# Patient Record
Sex: Male | Born: 1980 | Race: White | Hispanic: Yes | Marital: Single | Smoking: Never smoker
Health system: Southern US, Community
[De-identification: ages and names within clinical notes are randomized; demographics above are authoritative.]

## PROBLEM LIST (undated history)

## (undated) DIAGNOSIS — K760 Fatty (change of) liver, not elsewhere classified: Secondary | ICD-10-CM

## (undated) HISTORY — DX: Fatty (change of) liver, not elsewhere classified: K76.0

## (undated) HISTORY — PX: CHOLECYSTECTOMY: SHX55

---

## 2005-06-28 ENCOUNTER — Emergency Department (HOSPITAL_COMMUNITY): Admission: EM | Admit: 2005-06-28 | Discharge: 2005-06-28 | Payer: Self-pay | Admitting: Emergency Medicine
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
## 2006-06-14 ENCOUNTER — Emergency Department (HOSPITAL_COMMUNITY): Admission: EM | Admit: 2006-06-14 | Discharge: 2006-06-14 | Payer: Self-pay | Admitting: Emergency Medicine

## 2006-09-12 IMAGING — CR DG ANKLE COMPLETE 3+V*L*
3 series · 3 of 3 positions shown · non-contrast
Comparison: none

CLINICAL DATA: Twisted ankle.
 LEFT ANKLE ? 3 VIEW:

[view not recorded (1 of 3)]
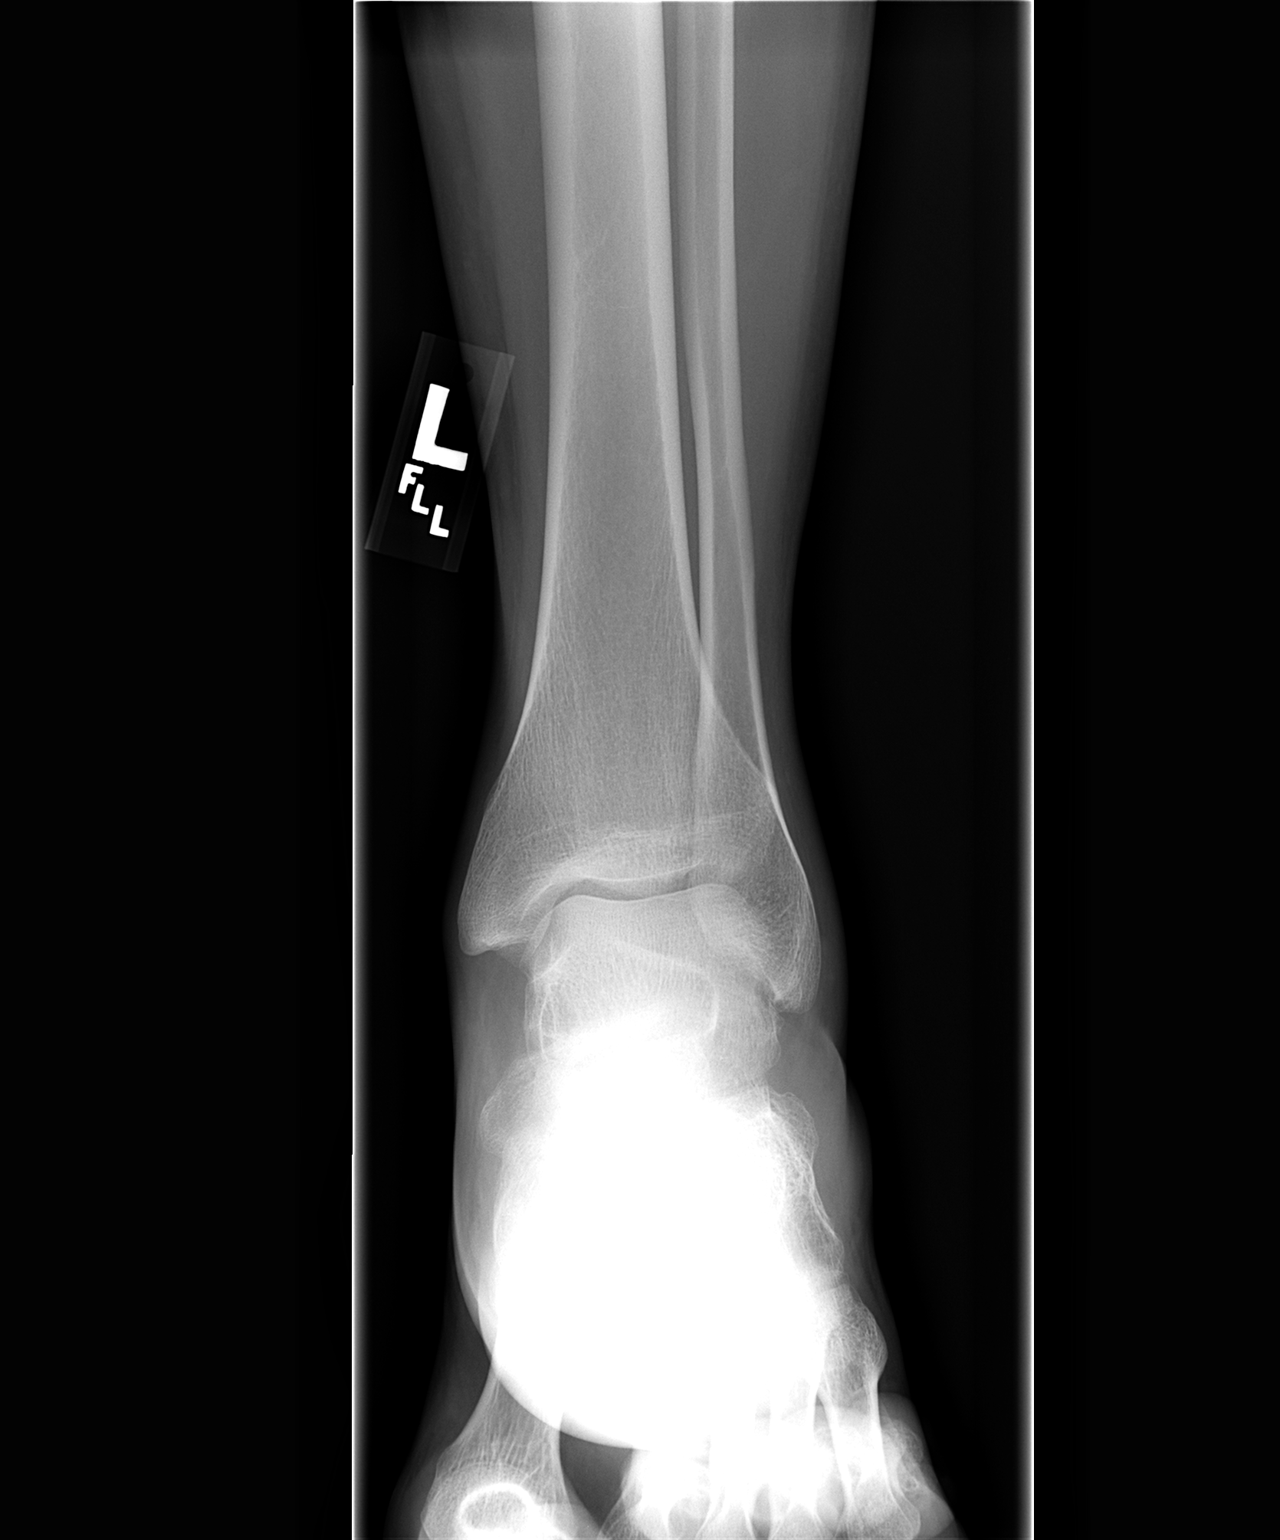

[view not recorded (2 of 3)]
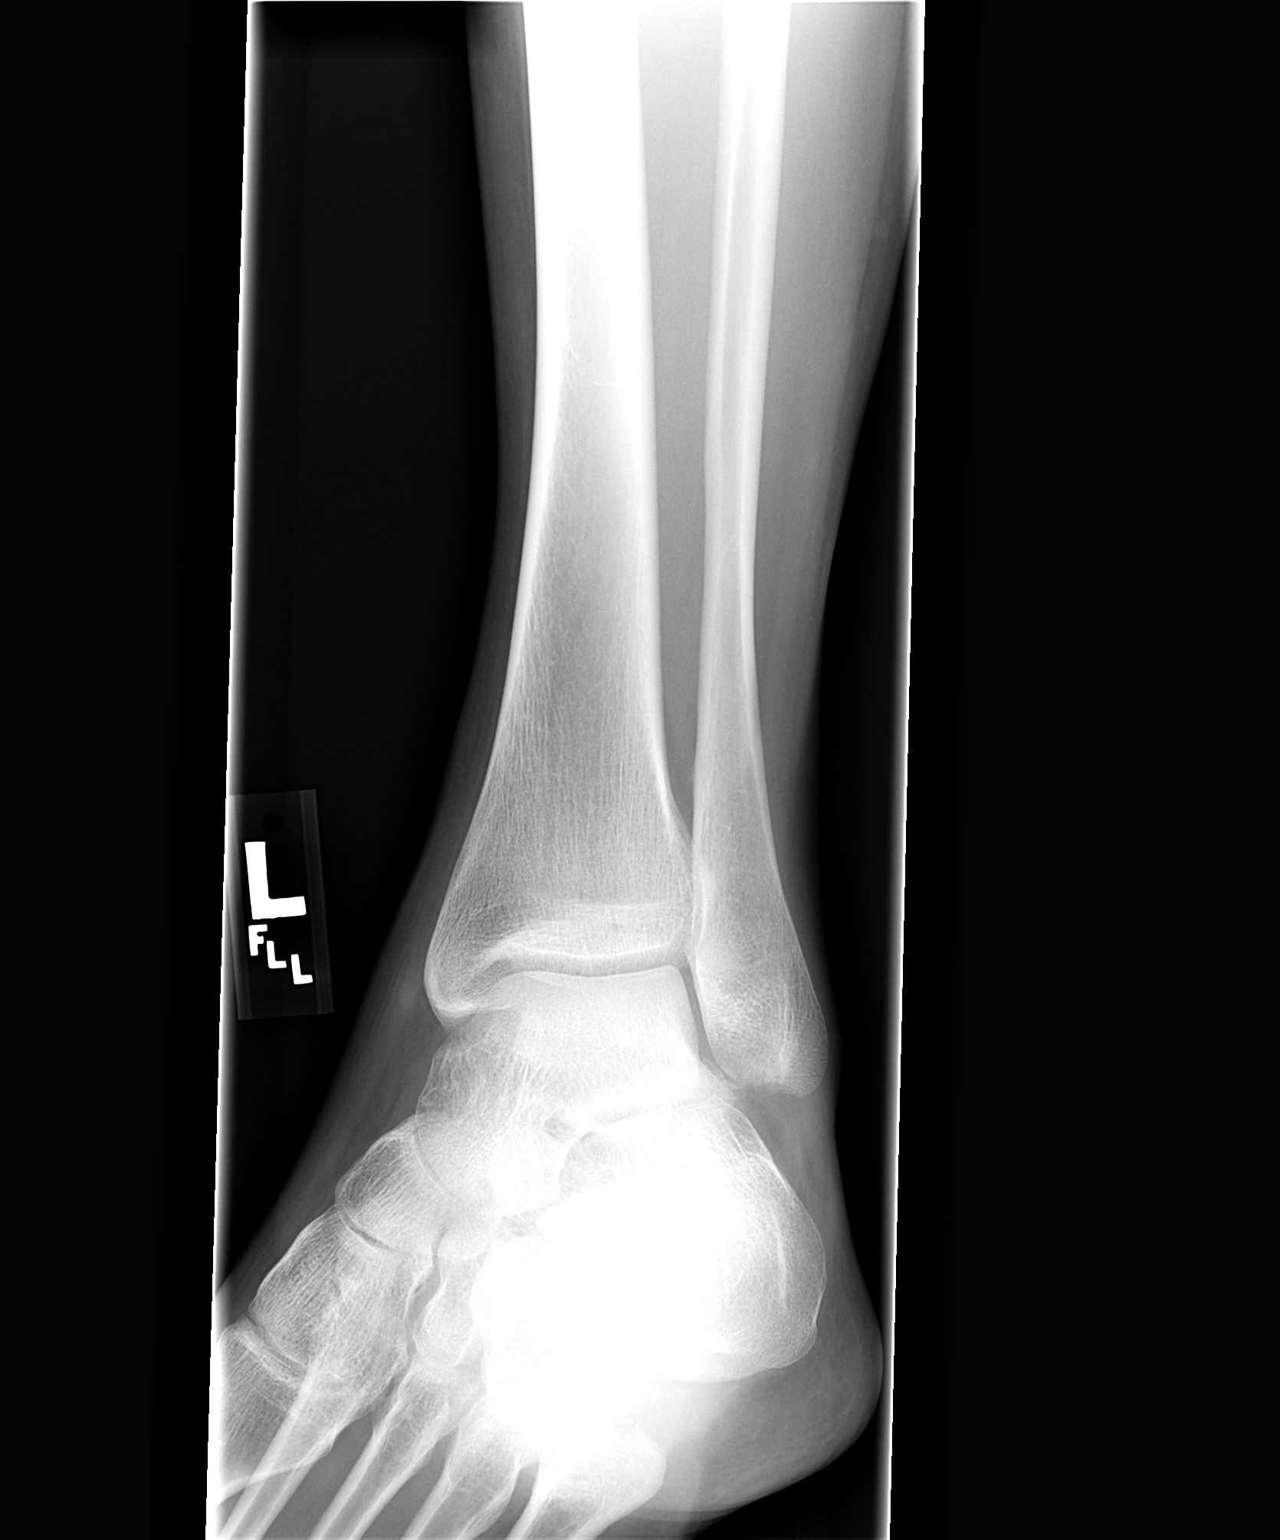

[view not recorded (3 of 3)]
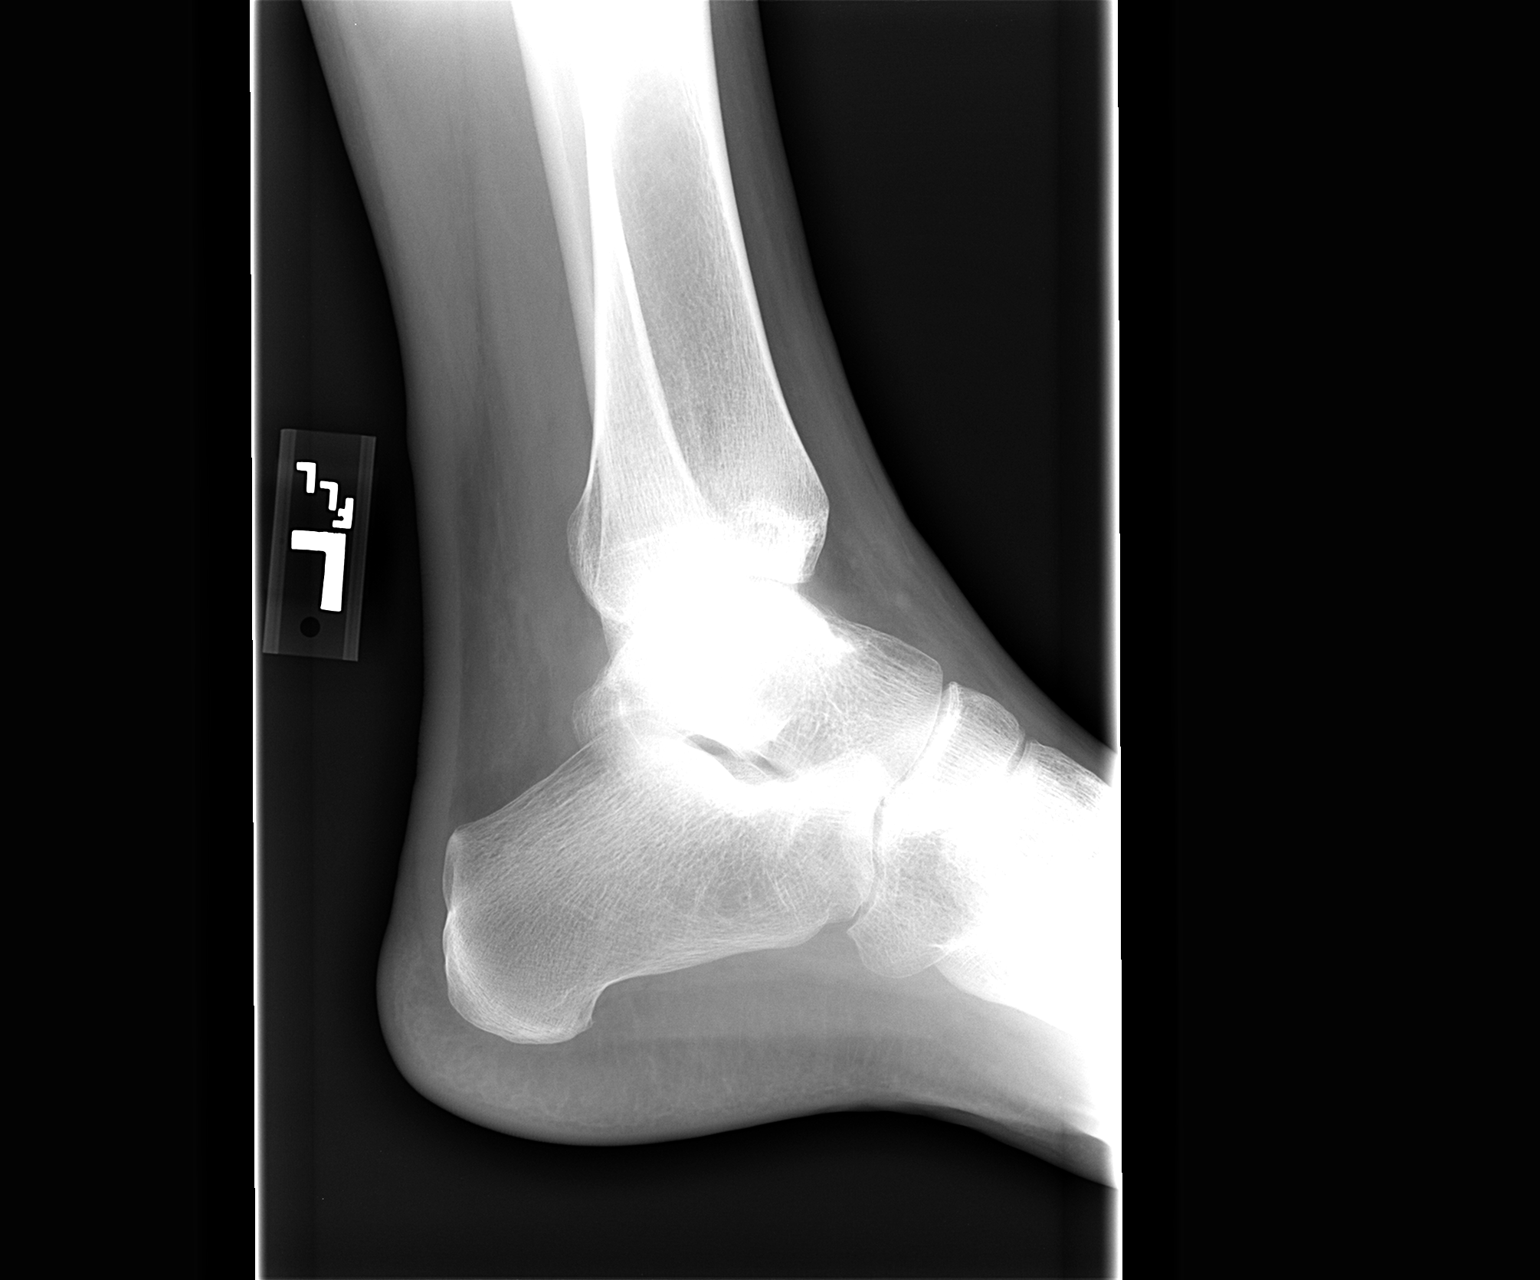

[3 of 3 positions shown; findings below may reference images not displayed]

FINDINGS: Imaged bones, joints and soft tissues appear normal.
IMPRESSION: Negative exam.

## 2010-04-04 ENCOUNTER — Ambulatory Visit (HOSPITAL_COMMUNITY): Admission: RE | Admit: 2010-04-04 | Discharge: 2010-04-04 | Payer: Self-pay | Admitting: General Surgery

## 2011-01-30 LAB — BASIC METABOLIC PANEL
BUN: 10 mg/dL (ref 6–23)
Calcium: 9.4 mg/dL (ref 8.4–10.5)
Chloride: 104 mEq/L (ref 96–112)
Creatinine, Ser: 0.95 mg/dL (ref 0.4–1.5)
GFR calc Af Amer: >60 mL/min (ref >60)
Sodium: 139 mEq/L (ref 135–145)

## 2011-01-30 LAB — CBC
HCT: 46.4 % (ref 39.0–52.0)
Hemoglobin: 16.1 g/dL (ref 13.0–17.0)
MCHC: 34.7 g/dL (ref 30.0–36.0)
MCV: 86.8 fL (ref 78.0–100.0)
RDW: 13.2 % (ref 11.5–15.5)

## 2015-07-08 ENCOUNTER — Encounter: Payer: Self-pay | Admitting: Family

## 2015-07-08 ENCOUNTER — Ambulatory Visit (INDEPENDENT_AMBULATORY_CARE_PROVIDER_SITE_OTHER): Payer: Self-pay | Admitting: Family

## 2015-07-08 VITALS — BP 132/92 | HR 84 | Temp 97.6°F | Resp 18 | Ht 71.0 in | Wt 180.0 lb

## 2015-07-08 DIAGNOSIS — R109 Unspecified abdominal pain: Secondary | ICD-10-CM

## 2015-07-08 DIAGNOSIS — R5383 Other fatigue: Secondary | ICD-10-CM | POA: Insufficient documentation

## 2015-07-08 HISTORY — DX: Other fatigue: R53.83

## 2015-07-08 HISTORY — DX: Unspecified abdominal pain: R10.9

## 2015-07-08 NOTE — Assessment & Plan Note (Signed)
Right-sided abdominal/rib pain of undetermined origin. Abdominal exam benign and unable to reproduce symptoms described. Question possible relation to fatty liver. Obtain complete metabolic panel to rule out liver dysfunction. Patient is currently self-pay, pending lab work if affordable possible ultrasound to determine current status.

## 2015-07-08 NOTE — Progress Notes (Signed)
Subjective:    Patient ID: Patrick Charles, male    DOB: 02-07-1981, 34 y.o.   MRN: [id number]  Chief Complaint  Patient presents with  . Establish Care    having issues with being tired and headaches, has also been feeling pain on his right side, when he got his gall bladder out the doctor said he has fatty liver    HPI:  Patrick Charles is a 34 y.o. male with a PMH of nonalcoholic fatty liver disease who presents today for an office visit to establish care.    1.) Fatigue - Associated symptom of tiredness/fatigue have been going on for a couple of years. Currently averaging about 5-6 hours of sleep on average. Denies any treatments to improve his fatigue.   2.) Right side pain - Associated symptom of pain located in his right upper quadrant of his abdomen has been going on for about 4 years. Describes the pain radiates around his side to his back. Aggrevating factors include eating and when he drinks a lot of fluids. Severity of the pain is about an 8/10 and and describes it as pressure. Frequency comes and goes.   3.) Fatty liver - Previously diagnosed with fatty liver in about 2010. Has not had any liver function tests done.   Allergies  Allergen Reactions  . Penicillins     Mouth sores     No outpatient prescriptions prior to visit.   No facility-administered medications prior to visit.     Past Medical History  Diagnosis Date  . NAFL (nonalcoholic fatty liver)      Past Surgical History  Procedure Laterality Date  . Cholecystectomy       Family History  Problem Relation Age of Onset  . Healthy Mother   . Healthy Father   . Diabetes Maternal Grandmother   . Gallbladder disease Maternal Grandfather      Social History   Social History  . Marital Status: Single    Spouse Name: N/A  . Number of Children: 3  . Years of Education: 14   Occupational History  . Server    Social History Main Topics  . Smoking status: Never Smoker   .  Smokeless tobacco: Never Used  . Alcohol Use: 0.0 oz/week    0 Standard drinks or equivalent per week     Comment: Rarely  . Drug Use: No  . Sexual Activity: Not on file   Other Topics Concern  . Not on file   Social History Narrative   Fun: No time to have fun, travel   Denies religious beliefs effecting health care    Review of Systems  Constitutional: Negative for fever and chills.  Respiratory: Negative for chest tightness and shortness of breath.   Cardiovascular: Negative for chest pain, palpitations and leg swelling.  Gastrointestinal: Negative for nausea, vomiting, diarrhea, constipation and blood in stool.      Objective:    BP 132/92 mmHg  Pulse 84  Temp(Src) 97.6 F (36.4 C) (Oral)  Resp 18  Ht '5\' 11"'  (1.803 m)  Wt 180 lb (81.647 kg)  BMI 25.12 kg/m2  SpO2 97% Nursing note and vital signs reviewed.  Physical Exam  Constitutional: He is oriented to person, place, and time. He appears well-developed and well-nourished. No distress.  Cardiovascular: Normal rate, regular rhythm, normal heart sounds and intact distal pulses.   Pulmonary/Chest: Effort normal and breath sounds normal.  Abdominal: Bowel sounds are normal. He exhibits no mass. There is no hepatosplenomegaly.  There is no tenderness. There is no rigidity, no rebound, no guarding, no tenderness at McBurney's point and negative Murphy's sign.  Neurological: He is alert and oriented to person, place, and time.  Skin: Skin is warm and dry.  Psychiatric: He has a normal mood and affect. His behavior is normal. Judgment and thought content normal.       Assessment & Plan:   Problem List Items Addressed This Visit      Other   Fatigue - Primary    Fatigue of undetermined origin, however cannot rule out occupational stressors. Obtain CBC, hemoglobin A1c, lipid panel, testosterone, and TSH to rule out metabolic and cardiovascular causes. Cannot rule out underlying anxiety, depression, or cardiovascular  disease. Continue to eat well and adjust sleep as needed. Follow-up pending lab work.      Relevant Orders   CBC   Hemoglobin A1c   TSH   Lipid panel   Testosterone   Comp Met (CMET)   Right-sided abdominal pain of unknown cause    Right-sided abdominal/rib pain of undetermined origin. Abdominal exam benign and unable to reproduce symptoms described. Question possible relation to fatty liver. Obtain complete metabolic panel to rule out liver dysfunction. Patient is currently self-pay, pending lab work if affordable possible ultrasound to determine current status.      Relevant Orders   Comp Met (CMET)

## 2015-07-08 NOTE — Assessment & Plan Note (Signed)
Fatigue of undetermined origin, however cannot rule out occupational stressors. Obtain CBC, hemoglobin A1c, lipid panel, testosterone, and TSH to rule out metabolic and cardiovascular causes. Cannot rule out underlying anxiety, depression, or cardiovascular disease. Continue to eat well and adjust sleep as needed. Follow-up pending lab work.

## 2015-07-08 NOTE — Progress Notes (Signed)
Pre visit review using our clinic review tool, if applicable. No additional management support is needed unless otherwise documented below in the visit note. 

## 2015-07-08 NOTE — Patient Instructions (Signed)
Thank you for choosing [facility].  Summary/Instructions:  Please stop by the lab between the hours of 7:30am-9:30am in a fasting state.   Please stop by the lab on the basement level of the building for your blood work. Your results will be released to MyChart (or called to you) after review, usually within 72 hours after test completion. If any changes need to be made, you will be notified at that same time.  If your symptoms worsen or fail to improve, please contact our office for further instruction, or in case of emergency go directly to the emergency room at the closest medical facility.      Fatigue Fatigue is a feeling of tiredness, lack of energy, lack of motivation, or feeling tired all the time. Having enough rest, good nutrition, and reducing stress will normally reduce fatigue. Consult your caregiver if it persists. The nature of your fatigue will help your caregiver to find out its cause. The treatment is based on the cause.  CAUSES  There are many causes for fatigue. Most of the time, fatigue can be traced to one or more of your habits or routines. Most causes fit into one or more of three general areas. They are: Lifestyle problems  Sleep disturbances.  Overwork.  Physical exertion.  Unhealthy habits.  Poor eating habits or eating disorders.  Alcohol and/or drug use .  Lack of proper nutrition (malnutrition). Psychological problems  Stress and/or anxiety problems.  Depression.  Grief.  Boredom. Medical Problems or Conditions  Anemia.  Pregnancy.  Thyroid gland problems.  Recovery from major surgery.  Continuous pain.  Emphysema or asthma that is not well controlled  Allergic conditions.  Diabetes.  Infections (such as mononucleosis).  Obesity.  Sleep disorders, such as sleep apnea.  Heart failure or other heart-related problems.  Cancer.  Kidney disease.  Liver disease.  Effects of certain medicines such as  antihistamines, cough and cold remedies, prescription pain medicines, heart and blood pressure medicines, drugs used for treatment of cancer, and some antidepressants. SYMPTOMS  The symptoms of fatigue include:   Lack of energy.  Lack of drive (motivation).  Drowsiness.  Feeling of indifference to the surroundings. DIAGNOSIS  The details of how you feel help guide your caregiver in finding out what is causing the fatigue. You will be asked about your present and past health condition. It is important to review all medicines that you take, including prescription and non-prescription items. A thorough exam will be done. You will be questioned about your feelings, habits, and normal lifestyle. Your caregiver may suggest blood tests, urine tests, or other tests to look for common medical causes of fatigue.  TREATMENT  Fatigue is treated by correcting the underlying cause. For example, if you have continuous pain or depression, treating these causes will improve how you feel. Similarly, adjusting the dose of certain medicines will help in reducing fatigue.  HOME CARE INSTRUCTIONS   Try to get the required amount of good sleep every night.  Eat a healthy and nutritious diet, and drink enough water throughout the day.  Practice ways of relaxing (including yoga or meditation).  Exercise regularly.  Make plans to change situations that cause stress. Act on those plans so that stresses decrease over time. Keep your work and personal routine reasonable.  Avoid street drugs and minimize use of alcohol.  Start taking a daily multivitamin after consulting your caregiver. SEEK MEDICAL CARE IF:   You have persistent tiredness, which cannot be accounted for.  You have fever.  You have unintentional weight loss.  You have headaches.  You have disturbed sleep throughout the night.  You are feeling sad.  You have constipation.  You have dry skin.  You have gained weight.  You are taking  any new or different medicines that you suspect are causing fatigue.  You are unable to sleep at night.  You develop any unusual swelling of your legs or other parts of your body. SEEK IMMEDIATE MEDICAL CARE IF:   You are feeling confused.  Your vision is blurred.  You feel faint or pass out.  You develop severe headache.  You develop severe abdominal, pelvic, or back pain.  You develop chest pain, shortness of breath, or an irregular or fast heartbeat.  You are unable to pass a normal amount of urine.  You develop abnormal bleeding such as bleeding from the rectum or you vomit blood.  You have thoughts about harming yourself or committing suicide.  You are worried that you might harm someone else. MAKE SURE YOU:   Understand these instructions.  Will watch your condition.  Will get help right away if you are not doing well or get worse. Document Released: 08/27/2007 Document Revised: 01/22/2012 Document Reviewed: 03/03/2014 [facility] Patient Information 2015 [employer], [place]. This information is not intended to replace advice given to you by your health care provider. Make sure you discuss any questions you have with your health care provider.

## 2019-01-13 ENCOUNTER — Other Ambulatory Visit: Payer: Self-pay | Admitting: Gastroenterology

## 2019-01-13 DIAGNOSIS — R1012 Left upper quadrant pain: Secondary | ICD-10-CM

## 2019-01-15 ENCOUNTER — Ambulatory Visit
Admission: RE | Admit: 2019-01-15 | Discharge: 2019-01-15 | Disposition: A | Discharge status: Home / Self Care | Payer: Self-pay | Source: Ambulatory Visit | Attending: Gastroenterology | Admitting: Gastroenterology

## 2019-01-15 ENCOUNTER — Other Ambulatory Visit: Payer: Self-pay

## 2019-01-15 DIAGNOSIS — R1012 Left upper quadrant pain: Secondary | ICD-10-CM

## 2019-02-20 DIAGNOSIS — J301 Allergic rhinitis due to pollen: Secondary | ICD-10-CM | POA: Insufficient documentation

## 2019-02-20 DIAGNOSIS — J019 Acute sinusitis, unspecified: Secondary | ICD-10-CM

## 2019-02-20 HISTORY — DX: Acute sinusitis, unspecified: J01.90

## 2022-11-17 ENCOUNTER — Ambulatory Visit: Payer: BC Managed Care – PPO | Admitting: Family

## 2022-11-17 ENCOUNTER — Encounter: Payer: Self-pay | Admitting: Podiatry

## 2022-11-17 ENCOUNTER — Ambulatory Visit: Payer: BC Managed Care – PPO | Admitting: Podiatry

## 2022-11-17 ENCOUNTER — Encounter: Payer: Self-pay | Admitting: Family

## 2022-11-17 VITALS — BP 120/68

## 2022-11-17 VITALS — BP 110/72 | HR 79 | Temp 97.7°F | Ht 71.0 in | Wt 204.2 lb

## 2022-11-17 DIAGNOSIS — B351 Tinea unguium: Secondary | ICD-10-CM | POA: Diagnosis not present

## 2022-11-17 DIAGNOSIS — F32A Depression, unspecified: Secondary | ICD-10-CM | POA: Diagnosis not present

## 2022-11-17 DIAGNOSIS — K219 Gastro-esophageal reflux disease without esophagitis: Secondary | ICD-10-CM | POA: Insufficient documentation

## 2022-11-17 DIAGNOSIS — F419 Anxiety disorder, unspecified: Secondary | ICD-10-CM | POA: Diagnosis not present

## 2022-11-17 MED ORDER — ESCITALOPRAM OXALATE 5 MG PO TABS: 5.0000 mg | ORAL_TABLET | Freq: Every day | ORAL | 2 refills | Status: DC

## 2022-11-17 MED ORDER — PANTOPRAZOLE SODIUM 20 MG PO TBEC: 20.0000 mg | DELAYED_RELEASE_TABLET | Freq: Every day | ORAL | 2 refills | Status: DC

## 2022-11-17 NOTE — Assessment & Plan Note (Addendum)
New pt feels overwhelmed with family and job stress, but mostly w/keeping up with job demands believe it is contributing to his sx of dizziness and GERD sx advised on lifestyle modifications including exercise, yoga, meditation, handout provided starting low dose Lexapro referring for therapy f/u 1 month
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
## 2022-11-17 NOTE — Assessment & Plan Note (Addendum)
new discussed eating/drinking low acid diet, also provided handout of food tips believe mostly due to increased stress sending Protonix, advised on use & SE f/u 1 month

## 2022-11-17 NOTE — Progress Notes (Signed)
  Subjective:  Patient ID: Patrick Charles, male    DOB: [birth year],  MRN: [id number]  Chief Complaint  Patient presents with   Nail Problem    Hallux nail discolored     42 y.o. male presents with the above complaint.  Patient presents with left hallux thickened elongated dystrophic mycotic nails x 1.  Patient says been present for quite some time is progressive gotten worse worse with ambulation mildly.  Overall it is more cosmetic.  He wanted to get it evaluated wanted discuss treatment options.  Denies any other acute complaints.   Review of Systems: Negative except as noted in the HPI. Denies N/V/F/Ch.  Past Medical History:  Diagnosis Date   NAFL (nonalcoholic fatty liver)     Current Outpatient Medications:    omeprazole (PRILOSEC) 20 MG capsule, daily., Disp: , Rfl:   Social History   Tobacco Use  Smoking Status Never  Smokeless Tobacco Never    Allergies  Allergen Reactions   Penicillins     Mouth sores   Objective:   Vitals:   11/17/22 0808  BP: 120/68   There is no height or weight on file to calculate BMI. Constitutional Well developed. Well nourished.  Vascular Dorsalis pedis pulses palpable bilaterally. Posterior tibial pulses palpable bilaterally. Capillary refill normal to all digits.  No cyanosis or clubbing noted. Pedal hair growth normal.  Neurologic Normal speech. Oriented to person, place, and time. Epicritic sensation to light touch grossly present bilaterally.  Dermatologic Nails thickened elongated dystrophic mycotic nails x 1 left hallux.  Mild pain on palpation Skin within normal limits  Orthopedic: Normal joint ROM without pain or crepitus bilaterally. No visible deformities. No bony tenderness.   Radiographs: None Assessment:   1. Nail fungus   2. Onychomycosis due to dermatophyte    Plan:  Patient was evaluated and treated and all questions answered.  Left hallux onychomycosis/nail fungus -Educated the patient on the  etiology of onychomycosis and various treatment options associated with improving the fungal load.  I explained to the patient that there is 3 treatment options available to treat the onychomycosis including topical, p.o., laser treatment.  Patient elected to undergo p.o. options with Lamisil/terbinafine therapy.  In order for me to start the medication therapy, I explained to the patient the importance of evaluating the liver and obtaining the liver function test.  Once the liver function test comes back normal I will start him on 82-month course of Lamisil therapy.  Patient understood all risk and would like to proceed with Lamisil therapy.  I have asked the patient to immediately stop the Lamisil therapy if she has any reactions to it and call the office or go to the emergency room right away.  Patient states understanding   No follow-ups on file.

## 2022-11-17 NOTE — Patient Instructions (Addendum)
Welcome to [facility], It was a pleasure meeting you today!    As discussed, I have sent your medications to your pharmacy.  Remember to eat a low acid diet, very little caffeine. See the handouts attached for reducing stress and diet tips.  I have sent a referral to our therapy office.   Please schedule a 1 month follow up visit today.    PLEASE NOTE: If you had any LAB tests please let us know if you have not heard back within a few days. You may see your results on MyChart before we have a chance to review them but we will give you a call once they are reviewed by [place]. If we ordered any REFERRALS today, please let us know if you have not heard from their office within the next week.  Let us know through MyChart if you are needing REFILLS, or have your pharmacy send [place] the request. You can also use MyChart to communicate with me or any office staff.  Please try these tips to maintain a healthy lifestyle: It is important that you exercise regularly at least 30 minutes 5 times a week. Think about what you will eat, plan ahead. Choose whole foods, & think  "clean, green, fresh or frozen" over canned, processed or packaged foods which are more sugary, salty, and fatty. 70 to 75% of food eaten should be fresh vegetables and protein. 2-3  meals daily with healthy snacks between meals, but must be whole fruit, protein or vegetables. Aim to eat over a 10 hour period when you are active, for example, 7am to 5pm, and then STOP after your last meal of the day, drinking only water.  Shorter eating windows, 6-8 hours, are showing benefits in heart disease and blood sugar regulation. Drink water every day! Shoot for 64 ounces daily = 8 cups, no other drink is as healthy! Fruit juice is best enjoyed in a healthy way, by EATING the fruit.

## 2022-11-24 ENCOUNTER — Ambulatory Visit (INDEPENDENT_AMBULATORY_CARE_PROVIDER_SITE_OTHER): Payer: BC Managed Care – PPO | Admitting: *Deleted

## 2022-11-24 DIAGNOSIS — B351 Tinea unguium: Secondary | ICD-10-CM

## 2022-11-24 NOTE — Progress Notes (Signed)
Patient presents today for the 1st laser treatment. Diagnosed with mycotic nail infection by Dr. Posey Pronto.   Toenail most affected hallux nail left.  All other systems are negative.  Nails were filed thin. Laser therapy was administered to 1st toenails left and patient tolerated the treatment well. All safety precautions were in place.    Follow up in 4 weeks for laser # 2.

## 2022-11-24 NOTE — Patient Instructions (Signed)

## 2022-12-14 ENCOUNTER — Encounter: Payer: Self-pay | Admitting: Podiatry

## 2022-12-19 ENCOUNTER — Ambulatory Visit: Payer: BC Managed Care – PPO | Admitting: Family

## 2022-12-19 VITALS — BP 115/78 | HR 62 | Temp 97.7°F | Ht 71.0 in | Wt 205.4 lb

## 2022-12-19 DIAGNOSIS — F419 Anxiety disorder, unspecified: Secondary | ICD-10-CM | POA: Diagnosis not present

## 2022-12-19 DIAGNOSIS — F32A Depression, unspecified: Secondary | ICD-10-CM

## 2022-12-19 DIAGNOSIS — R21 Rash and other nonspecific skin eruption: Secondary | ICD-10-CM

## 2022-12-19 DIAGNOSIS — K219 Gastro-esophageal reflux disease without esophagitis: Secondary | ICD-10-CM | POA: Diagnosis not present

## 2022-12-19 MED ORDER — OMEPRAZOLE 20 MG PO CPDR: 20.0000 mg | DELAYED_RELEASE_CAPSULE | Freq: Every day | ORAL | 3 refills | Status: AC

## 2022-12-19 MED ORDER — PANTOPRAZOLE SODIUM 20 MG PO TBEC: 20.0000 mg | DELAYED_RELEASE_TABLET | Freq: Every day | ORAL | 1 refills | Status: DC

## 2022-12-19 MED ORDER — ESCITALOPRAM OXALATE 5 MG PO TABS: 5.0000 mg | ORAL_TABLET | Freq: Every day | ORAL | 1 refills | Status: AC

## 2022-12-19 NOTE — Assessment & Plan Note (Addendum)
New, stable started Lexapro 5mg  qd last visit, tolerating & is feeling less stressed also referred for therapy but he never heard from anyone, but the referral says pt refused an appointment? will contact referral team f/u 3-6 months

## 2022-12-19 NOTE — Patient Instructions (Addendum)
It was very nice to see you today!   For the back rash/acne look for Oxy wash where you find the facial cleaning products are found in [facility] or any pharmacy  Schedule a physical with fasting labs at your convenience.  Have a great week!   PLEASE NOTE:  If you had any lab tests please let us know if you have not heard back within a few days. You may see your results on MyChart before we have a chance to review them but we will give you a call once they are reviewed by [place]. If we ordered any referrals today, please let us know if you have not heard from their office within the next week.

## 2022-12-19 NOTE — Progress Notes (Signed)
Patient ID: Patrick Charles, male    DOB: Apr 07, 1981, 42 y.o.   MRN: [id number]  Chief Complaint  Patient presents with   Follow-up    Anxiety/ depression has been better.     HPI: GERD:  HX: last visit - pt reported feeling bloated after every meal, with a lot of gas and burping, also reported loud stomach gurgling sounds after eating. He has had GB removed, also has fatty liver disease. c/o stomach pain every time he eats under rib cage. Sx present for 6 months. Has not tried anything for symptoms. also describes pain on both sides just under his ribs, sometimes feels tingling.   Skin rash:  pt reports it is located on his back after being in sun - mix of acne pimples and some red bumps,  Denies itching, pt unsure if breakout due to the heat/sun or having a sweaty shirt on close to his skin for long time.  Anxiety & depression:  HX:  feels stressed daily, has family demands and a stressful job and feels overwhelmed at times. Having stomach pain, bloating, and dizziness, most likely manifestations of his anxiety. reports feeling before he gets out of bed most days and then continues thru the day sometimes. Last visit started on Lexapro 5mg , pt feels it has been helping, denies SE except maybe constipation.  Assessment & Plan:   Problem List Items Addressed This Visit       Digestive   Gastroesophageal reflux disease without esophagitis    New pt started on Protonix last visit, but pt reports pharmacy did not give to him called pharm & it requires PA? CMA to complete PA, if not approved right away, sending Prilosec 20mg  qd f/u 6 mos or prn      Relevant Medications   omeprazole (PRILOSEC) 20 MG capsule     Other   Anxiety and depression - Primary    New, stable started Lexapro 5mg  qd last visit, tolerating & is feeling less stressed also referred for therapy but he never heard from anyone, but the referral says pt refused an appointment? will contact referral team f/u 3-6  months      Relevant Medications   escitalopram (LEXAPRO) 5 MG tablet   Other Visit Diagnoses     Skin rash    - advised on using sunscreen every time prior to going outside. Advised to try OTC Oxy wash or similar acne wash and use daily in shower. Advised changing out of sweaty clothes soon after finishing activity/work.   Subjective:    Outpatient Medications Prior to Visit  Medication Sig Dispense Refill   escitalopram (LEXAPRO) 5 MG tablet Take 1 tablet (5 mg total) by mouth daily. 30 tablet 2   pantoprazole (PROTONIX) 20 MG tablet Take 1 tablet (20 mg total) by mouth daily. Take 1 pill twice a day for 2 weeks, then 1 pill every morning. 45 tablet 2   No facility-administered medications prior to visit.   Past Medical History:  Diagnosis Date   Acute non-recurrent sinusitis 02/20/2019   Fatigue 07/08/2015   NAFL (nonalcoholic fatty liver)    Right-sided abdominal pain of unknown cause 07/08/2015   Past Surgical History:  Procedure Laterality Date   CHOLECYSTECTOMY     Allergies  Allergen Reactions   Penicillins     Mouth sores      Objective:    Physical Exam Vitals and nursing note reviewed.  Constitutional:      General: He is not in acute  distress.    Appearance: Normal appearance.  HENT:     Head: Normocephalic.  Cardiovascular:     Rate and Rhythm: Normal rate and regular rhythm.  Pulmonary:     Effort: Pulmonary effort is normal.     Breath sounds: Normal breath sounds.  Musculoskeletal:        General: Normal range of motion.     Cervical back: Normal range of motion.  Skin:    General: Skin is warm and dry.  Neurological:     Mental Status: He is alert and oriented to person, place, and time.  Psychiatric:        Mood and Affect: Mood normal.    BP 115/78 (BP Location: Left Arm, Patient Position: Sitting, Cuff Size: Large)   Pulse 62   Temp 97.7 F (36.5 C) (Temporal)   Ht 5\' 11"  (1.803 m)   Wt 205 lb 6.4 oz (93.2 kg)   SpO2 95%   BMI  28.65 kg/m  Wt Readings from Last 3 Encounters:  12/19/22 205 lb 6.4 oz (93.2 kg)  11/17/22 204 lb 3.2 oz (92.6 kg)  07/08/15 180 lb (81.6 kg)       Jeanie Sewer, NP

## 2022-12-19 NOTE — Assessment & Plan Note (Signed)
New pt started on Protonix last visit, but pt reports pharmacy did not give to him called pharm & it requires PA? CMA to complete PA, if not approved right away, sending Prilosec 20mg  qd f/u 6 mos or prn

## 2022-12-21 ENCOUNTER — Ambulatory Visit (INDEPENDENT_AMBULATORY_CARE_PROVIDER_SITE_OTHER): Payer: BC Managed Care – PPO | Admitting: *Deleted

## 2022-12-21 DIAGNOSIS — B351 Tinea unguium: Secondary | ICD-10-CM

## 2022-12-21 NOTE — Progress Notes (Signed)
Patient presents today for the 2nd laser treatment. Diagnosed with mycotic nail infection by Dr. Posey Pronto.   Toenail most affected hallux nail left. Patient states he already sees some improvement.  All other systems are negative.  Nails were filed thin. Laser therapy was administered to 1st toenail left and patient tolerated the treatment well. All safety precautions were in place.    Follow up in 4 weeks for laser # 3.

## 2022-12-22 ENCOUNTER — Other Ambulatory Visit: Payer: BC Managed Care – PPO

## 2023-01-02 ENCOUNTER — Ambulatory Visit (INDEPENDENT_AMBULATORY_CARE_PROVIDER_SITE_OTHER): Payer: BC Managed Care – PPO | Admitting: Family

## 2023-01-02 ENCOUNTER — Encounter: Payer: Self-pay | Admitting: Family

## 2023-01-02 VITALS — BP 109/72 | HR 76 | Temp 98.3°F | Ht 71.0 in | Wt 205.0 lb

## 2023-01-02 DIAGNOSIS — Z0000 Encounter for general adult medical examination without abnormal findings: Secondary | ICD-10-CM | POA: Diagnosis not present

## 2023-01-02 DIAGNOSIS — Z1211 Encounter for screening for malignant neoplasm of colon: Secondary | ICD-10-CM | POA: Diagnosis not present

## 2023-01-02 NOTE — Patient Instructions (Addendum)
It was very nice to see you today!   Schedule a lab only, fasting appointment for another day this week if possible! I will review your lab results via MyChart in a few days after they are drawn. Have a great rest of the week!       PLEASE NOTE:  If you had any lab tests please let us know if you have not heard back within a few days. You may see your results on MyChart before we have a chance to review them but we will give you a call once they are reviewed by [place]. If we ordered any referrals today, please let us know if you have not heard from their office within the next week.

## 2023-01-02 NOTE — Progress Notes (Signed)
Phone: [phone]  Subjective:  Patient 42 y.o. male presenting for annual physical.  Chief Complaint  Patient presents with   Annual Exam    Fasting w/ Labs   Gastroesophageal Reflux    Pt is not taking Prilosec.     See problem oriented charting- ROS- full  review of systems was completed and negative.   The following were reviewed and entered/updated in epic: Past Medical History:  Diagnosis Date   Acute non-recurrent sinusitis 02/20/2019   Fatigue 07/08/2015   NAFL (nonalcoholic fatty liver)    Right-sided abdominal pain of unknown cause 07/08/2015   Patient Active Problem List   Diagnosis Date Noted   Anxiety and depression 11/17/2022   Gastroesophageal reflux disease without esophagitis 11/17/2022   Seasonal allergic rhinitis due to pollen 02/20/2019   Past Surgical History:  Procedure Laterality Date   CHOLECYSTECTOMY      Family History  Problem Relation Age of Onset   Healthy Mother    Healthy Father    Diabetes Maternal Grandmother    Gallbladder disease Maternal Grandfather     Medications- reviewed and updated Current Outpatient Medications  Medication Sig Dispense Refill   escitalopram (LEXAPRO) 5 MG tablet Take 1 tablet (5 mg total) by mouth daily. 90 tablet 1   omeprazole (PRILOSEC) 20 MG capsule Take 1 capsule (20 mg total) by mouth daily. (Patient not taking: Reported on 01/02/2023) 30 capsule 3   No current facility-administered medications for this visit.    Allergies-reviewed and updated Allergies  Allergen Reactions   Penicillins     Mouth sores    Social History   Social History Narrative   Fun: No time to have fun, travel   Denies religious beliefs effecting health care    Objective:  BP 109/72 (BP Location: Left Arm, Patient Position: Sitting, Cuff Size: Large)   Pulse 76   Temp 98.3 F (36.8 C) (Temporal)   Ht 5' 11"$  (1.803 m)   Wt 205 lb (93 kg)   SpO2 98%   BMI 28.59 kg/m  Physical Exam Vitals and nursing note  reviewed.  Constitutional:      General: He is not in acute distress.    Appearance: Normal appearance.  HENT:     Head: Normocephalic.     Right Ear: Tympanic membrane and external ear normal.     Left Ear: Tympanic membrane and external ear normal.     Nose: Nose normal.     Mouth/Throat:     Mouth: Mucous membranes are moist.  Eyes:     Extraocular Movements: Extraocular movements intact.  Cardiovascular:     Rate and Rhythm: Normal rate and regular rhythm.  Pulmonary:     Effort: Pulmonary effort is normal.     Breath sounds: Normal breath sounds.  Abdominal:     General: Abdomen is flat. There is no distension.     Palpations: Abdomen is soft.     Tenderness: There is no abdominal tenderness.  Musculoskeletal:        General: Normal range of motion.     Cervical back: Normal range of motion.  Skin:    General: Skin is warm and dry.  Neurological:     Mental Status: He is alert and oriented to person, place, and time.  Psychiatric:        Mood and Affect: Mood normal.        Behavior: Behavior normal.        Judgment: Judgment normal.  Assessment and Plan   Health Maintenance counseling: 1. Anticipatory guidance: Patient counseled regarding regular dental exams q6 months, eye exams yearly, avoiding smoking and second hand smoke, limiting alcohol to 2 beverages per day.   2. Risk factor reduction:  Advised patient of need for regular exercise and diet rich in fruits and vegetables to reduce risk of heart attack and stroke. Exercise- walking for an hour 3d/week.   Wt Readings from Last 3 Encounters:  01/02/23 205 lb (93 kg)  12/19/22 205 lb 6.4 oz (93.2 kg)  11/17/22 204 lb 3.2 oz (92.6 kg)   3. Immunizations/screenings/ancillary studies  There is no immunization history on file for this patient. Health Maintenance Due  Topic Date Due   HIV Screening  Never done   Hepatitis C Screening  Never done   DTaP/Tdap/Td (1 - Tdap) Never done    4. Prostate cancer  screening >55yo - risk factors? N/A 5. Colon cancer screening: None - ordering today 6. Skin cancer screening- advised regular sunscreen use. Denies worrisome, changing, or new skin lesions.  7. Smoking associated screening (lung cancer screening, AAA screen 65-75, UA)- non- smoker 8. STD screening - N/A 9. Alcohol screening: rare  Problem List Items Addressed This Visit   None Visit Diagnoses     Annual physical exam    -  Primary   Relevant Orders   Comprehensive metabolic panel   TSH   Lipid panel   CBC with Differential/Platelet   Colon cancer screening       Relevant Orders   Ambulatory referral to Gastroenterology       Recommended follow up:   Return in about 1 week (around 01/09/2023) for fasting lab appt only - lab closed today. Future Appointments  Date Time Provider [place]  01/05/2023  9:15 AM [facility]  01/19/2023  9:00 AM [facility]    Lab/Order associations: fasting    Jeanie Sewer, NP

## 2023-01-05 ENCOUNTER — Other Ambulatory Visit (INDEPENDENT_AMBULATORY_CARE_PROVIDER_SITE_OTHER): Payer: BC Managed Care – PPO

## 2023-01-05 DIAGNOSIS — Z0000 Encounter for general adult medical examination without abnormal findings: Secondary | ICD-10-CM | POA: Diagnosis not present

## 2023-01-05 LAB — TSH: TSH: 1.72 u[IU]/mL (ref 0.35–5.50)

## 2023-01-05 LAB — LIPID PANEL
Cholesterol: 202 mg/dL — ABNORMAL HIGH (ref 0–200)
HDL: 34 mg/dL — ABNORMAL LOW (ref >39.00)
LDL Cholesterol: 145 mg/dL — ABNORMAL HIGH (ref 0–99)
NonHDL: 168.33
Total CHOL/HDL Ratio: 6
Triglycerides: 117 mg/dL (ref 0.0–149.0)
VLDL: 23.4 mg/dL (ref 0.0–40.0)

## 2023-01-05 LAB — COMPREHENSIVE METABOLIC PANEL
ALT: 41 U/L (ref 0–53)
AST: 25 U/L (ref 0–37)
Albumin: 4.5 g/dL (ref 3.5–5.2)
Alkaline Phosphatase: 49 U/L (ref 39–117)
BUN: 16 mg/dL (ref 6–23)
CO2: 25 mEq/L (ref 19–32)
Calcium: 9.8 mg/dL (ref 8.4–10.5)
Chloride: 105 mEq/L (ref 96–112)
Creatinine, Ser: 1.01 mg/dL (ref 0.40–1.50)
GFR: 91.38 mL/min (ref >60.00)
Glucose, Bld: 88 mg/dL (ref 70–99)
Potassium: 4.6 mEq/L (ref 3.5–5.1)
Sodium: 139 mEq/L (ref 135–145)
Total Bilirubin: 0.7 mg/dL (ref 0.2–1.2)
Total Protein: 7.1 g/dL (ref 6.0–8.3)

## 2023-01-05 LAB — CBC WITH DIFFERENTIAL/PLATELET
Basophils Absolute: 0 10*3/uL (ref 0.0–0.1)
Basophils Relative: 0.6 % (ref 0.0–3.0)
Eosinophils Absolute: 0.1 10*3/uL (ref 0.0–0.7)
Eosinophils Relative: 2.2 % (ref 0.0–5.0)
HCT: 47.4 % (ref 39.0–52.0)
Hemoglobin: 16.1 g/dL (ref 13.0–17.0)
Lymphocytes Relative: 30.4 % (ref 12.0–46.0)
Lymphs Abs: 1.9 10*3/uL (ref 0.7–4.0)
MCHC: 34 g/dL (ref 30.0–36.0)
MCV: 87.9 fl (ref 78.0–100.0)
Monocytes Absolute: 0.6 10*3/uL (ref 0.1–1.0)
Monocytes Relative: 9.7 % (ref 3.0–12.0)
Neutro Abs: 3.6 10*3/uL (ref 1.4–7.7)
Neutrophils Relative %: 57.1 % (ref 43.0–77.0)
Platelets: 263 10*3/uL (ref 150.0–400.0)
RBC: 5.39 Mil/uL (ref 4.22–5.81)
RDW: 13.6 % (ref 11.5–15.5)
WBC: 6.2 10*3/uL (ref 4.0–10.5)

## 2023-01-09 NOTE — Progress Notes (Signed)
Your glucose, electrolytes, blood count, thyroid, liver & kidney function are all normal.  Your total cholesterol number & LDL (bad #) are high. Try to reduce any fried foods, alcohol, nonnutritional snacks e.g. chips/cookies,pies, cakes and candies, fatty meat (red meat), high fat dairy foods:  including cheese, milk, ice cream in your diet. Increase intake of whole foods: vegetables, fruit, protein.    As we have already talked about, losing some weight, and starting an exercise routine, shooting for 1mn 5-7days per week will also help with lowering your cholesterol numbers.

## 2023-01-09 NOTE — Progress Notes (Signed)
see mychart message and call patient w/ results please.

## 2023-01-19 ENCOUNTER — Ambulatory Visit (INDEPENDENT_AMBULATORY_CARE_PROVIDER_SITE_OTHER): Payer: BC Managed Care – PPO | Admitting: *Deleted

## 2023-01-19 DIAGNOSIS — B351 Tinea unguium: Secondary | ICD-10-CM

## 2023-01-19 NOTE — Progress Notes (Signed)
Patient presents today for the 3rd laser treatment. Diagnosed with mycotic nail infection by Dr. Posey Pronto.   Toenail most affected hallux nail left. Patient states he already sees some improvement.  All other systems are negative.  Nails were filed thin. Laser therapy was administered to 1st toenail left and patient tolerated the treatment well. All safety precautions were in place.    Follow up in 6 weeks for laser # 4.

## 2023-01-23 DIAGNOSIS — M9903 Segmental and somatic dysfunction of lumbar region: Secondary | ICD-10-CM | POA: Diagnosis not present

## 2023-01-23 DIAGNOSIS — M4604 Spinal enthesopathy, thoracic region: Secondary | ICD-10-CM | POA: Diagnosis not present

## 2023-01-23 DIAGNOSIS — M9901 Segmental and somatic dysfunction of cervical region: Secondary | ICD-10-CM | POA: Diagnosis not present

## 2023-01-23 DIAGNOSIS — M9902 Segmental and somatic dysfunction of thoracic region: Secondary | ICD-10-CM | POA: Diagnosis not present

## 2023-01-25 DIAGNOSIS — M9903 Segmental and somatic dysfunction of lumbar region: Secondary | ICD-10-CM | POA: Diagnosis not present

## 2023-01-25 DIAGNOSIS — M4604 Spinal enthesopathy, thoracic region: Secondary | ICD-10-CM | POA: Diagnosis not present

## 2023-01-25 DIAGNOSIS — M9907 Segmental and somatic dysfunction of upper extremity: Secondary | ICD-10-CM | POA: Diagnosis not present

## 2023-01-25 DIAGNOSIS — M9902 Segmental and somatic dysfunction of thoracic region: Secondary | ICD-10-CM | POA: Diagnosis not present

## 2023-01-25 DIAGNOSIS — M9901 Segmental and somatic dysfunction of cervical region: Secondary | ICD-10-CM | POA: Diagnosis not present

## 2023-01-29 DIAGNOSIS — M9907 Segmental and somatic dysfunction of upper extremity: Secondary | ICD-10-CM | POA: Diagnosis not present

## 2023-01-29 DIAGNOSIS — M9902 Segmental and somatic dysfunction of thoracic region: Secondary | ICD-10-CM | POA: Diagnosis not present

## 2023-01-29 DIAGNOSIS — M9903 Segmental and somatic dysfunction of lumbar region: Secondary | ICD-10-CM | POA: Diagnosis not present

## 2023-01-29 DIAGNOSIS — M4604 Spinal enthesopathy, thoracic region: Secondary | ICD-10-CM | POA: Diagnosis not present

## 2023-01-29 DIAGNOSIS — M9901 Segmental and somatic dysfunction of cervical region: Secondary | ICD-10-CM | POA: Diagnosis not present

## 2023-02-02 DIAGNOSIS — M9902 Segmental and somatic dysfunction of thoracic region: Secondary | ICD-10-CM | POA: Diagnosis not present

## 2023-02-02 DIAGNOSIS — M9907 Segmental and somatic dysfunction of upper extremity: Secondary | ICD-10-CM | POA: Diagnosis not present

## 2023-02-02 DIAGNOSIS — M4604 Spinal enthesopathy, thoracic region: Secondary | ICD-10-CM | POA: Diagnosis not present

## 2023-02-02 DIAGNOSIS — M9903 Segmental and somatic dysfunction of lumbar region: Secondary | ICD-10-CM | POA: Diagnosis not present

## 2023-02-02 DIAGNOSIS — M9901 Segmental and somatic dysfunction of cervical region: Secondary | ICD-10-CM | POA: Diagnosis not present

## 2023-02-06 DIAGNOSIS — M9903 Segmental and somatic dysfunction of lumbar region: Secondary | ICD-10-CM | POA: Diagnosis not present

## 2023-02-06 DIAGNOSIS — M9907 Segmental and somatic dysfunction of upper extremity: Secondary | ICD-10-CM | POA: Diagnosis not present

## 2023-02-06 DIAGNOSIS — M9902 Segmental and somatic dysfunction of thoracic region: Secondary | ICD-10-CM | POA: Diagnosis not present

## 2023-02-06 DIAGNOSIS — M9901 Segmental and somatic dysfunction of cervical region: Secondary | ICD-10-CM | POA: Diagnosis not present

## 2023-02-06 DIAGNOSIS — M4604 Spinal enthesopathy, thoracic region: Secondary | ICD-10-CM | POA: Diagnosis not present

## 2023-03-02 ENCOUNTER — Ambulatory Visit (INDEPENDENT_AMBULATORY_CARE_PROVIDER_SITE_OTHER): Payer: BC Managed Care – PPO | Admitting: *Deleted

## 2023-03-02 DIAGNOSIS — B351 Tinea unguium: Secondary | ICD-10-CM

## 2023-03-02 NOTE — Progress Notes (Signed)
Patient presents today for the 4th laser treatment. Diagnosed with mycotic nail infection by Dr. Allena Katz.   Toenail most affected hallux nail left. Patient states he already sees some improvement.  All other systems are negative.  Nails were filed thin. Laser therapy was administered to 1st toenail left and patient tolerated the treatment well. All safety precautions were in place.    Follow up in 6 weeks for laser # 5.

## 2023-04-13 ENCOUNTER — Other Ambulatory Visit: Payer: BC Managed Care – PPO

## 2023-07-13 ENCOUNTER — Telehealth: Payer: Self-pay | Admitting: Family

## 2023-07-13 ENCOUNTER — Ambulatory Visit (INDEPENDENT_AMBULATORY_CARE_PROVIDER_SITE_OTHER): Payer: BC Managed Care – PPO | Admitting: *Deleted

## 2023-07-13 DIAGNOSIS — K219 Gastro-esophageal reflux disease without esophagitis: Secondary | ICD-10-CM

## 2023-07-13 DIAGNOSIS — B351 Tinea unguium: Secondary | ICD-10-CM

## 2023-07-13 NOTE — Telephone Encounter (Signed)
ok to resend thx

## 2023-07-13 NOTE — Progress Notes (Signed)
Patient presents today for the 4th laser treatment. Diagnosed with mycotic nail infection by Dr. Allena Katz.   Toenail most affected hallux nail left. Patient states he already sees some improvement.  All other systems are negative.  Nails were filed thin. Laser therapy was administered to 1st toenail left and patient tolerated the treatment well. All safety precautions were in place.    Follow up in 8 weeks for laser # 6.

## 2023-07-13 NOTE — Telephone Encounter (Signed)
Patient requests new Referral be sent to [facility]  - previous Referral is closed

## 2023-07-13 NOTE — Telephone Encounter (Signed)
Sent!

## 2023-09-07 ENCOUNTER — Ambulatory Visit (INDEPENDENT_AMBULATORY_CARE_PROVIDER_SITE_OTHER): Payer: BC Managed Care – PPO | Admitting: Podiatrist

## 2023-09-07 DIAGNOSIS — B351 Tinea unguium: Secondary | ICD-10-CM

## 2023-09-07 NOTE — Progress Notes (Signed)
Patient presents today for the 6th laser treatment. Diagnosed with mycotic nail infection by Dr. Allena Katz.   Toenail most affected hallux nail left. Patient states he already sees some improvement.  All other systems are negative.  Nails were filed thin. Laser therapy was administered to 1st toenail left and patient tolerated the treatment well. All safety precautions were in place.    Discussed he has seen improvement and recommended 1-2 more treatments then a follow up with Dr. Allena Katz.    Follow up in 8 weeks for laser # 7

## 2023-11-09 ENCOUNTER — Other Ambulatory Visit: Payer: BC Managed Care – PPO

## 2023-11-09 DIAGNOSIS — B351 Tinea unguium: Secondary | ICD-10-CM
